# Patient Record
Sex: Male | Born: 1985 | Race: Black or African American | Hispanic: No | Marital: Single | Smoking: Current every day smoker
Health system: Southern US, Community
[De-identification: ages and names within clinical notes are randomized; demographics above are authoritative.]

---

## 2019-10-31 ENCOUNTER — Encounter: Payer: Self-pay | Admitting: Emergency Medicine

## 2019-10-31 ENCOUNTER — Other Ambulatory Visit: Payer: Self-pay

## 2019-10-31 ENCOUNTER — Ambulatory Visit
Admission: EM | Admit: 2019-10-31 | Discharge: 2019-10-31 | Disposition: A | Discharge status: Home / Self Care | Payer: Self-pay | Attending: Emergency Medicine | Admitting: Emergency Medicine

## 2019-10-31 DIAGNOSIS — K05219 Aggressive periodontitis, localized, unspecified severity: Secondary | ICD-10-CM

## 2019-10-31 MED ORDER — AMOXICILLIN-POT CLAVULANATE 875-125 MG PO TABS: 1.0000 | ORAL_TABLET | Freq: Two times a day (BID) | ORAL | 0 refills | Status: AC

## 2019-10-31 NOTE — Discharge Instructions (Signed)
Finish the Augmentin, even if you feel better.  Continue Listerine and salt water rinses.  Follow up With a primary care physician and dentist as soon as you can.  See list below Here is a list of primary care providers who are taking new patients:  Dr. Otilio Miu, Dr. Adline Potter [address] [place] [address] [phone]  [facility] [place] [address]  [phone]  [facility] [address] [place], [place] [address] [phone]  [facility]  [phone] [place], [place] [address]  Here are clinics/ other resources who will see you if you do not have insurance. Some have certain criteria that you must meet. Call them and find out what they are:  [facility]: [address], [place], [place] [address] Phone: [phone] Hours: First and Third Saturdays of each Month, 9 a.m. - 1 p.m.  [facility]: [address], [place], [place], [place] [address] Phone: [phone] Hours: Tuesday, 4 p.m. - 8 p.m. Thursday, 1 p.m. - 8 p.m. Wednesday, 9 a.m. - [facility] [address], [place], [place] [address] Phone: [phone] Pharmacy Phone Number: [phone] Dental Phone Number: [phone] [facility]: [phone]  Dental Hours: Monday - Thursday, 8 a.m. - 6 p.m.  [facility] [address], [place], [place] [address] Phone: [phone] Pharmacy Phone Number: [phone] [facility] Insurance Help: [phone]  [facility] [address] [place], [place] [address] Phone: [phone] Pharmacy Phone Number: [phone] [facility] Insurance Help: [phone]  [facility] [address] [place], [place] [address] Phone: [phone] [facility] Insurance Help: [phone]   [place] [place],  [address] Phone: [phone]  Go to  www.goodrx.com to look up your medications. This will give you a list of where you can find your prescriptions at the most affordable prices. Or ask the pharmacist what the cash price is, or if they have any other discount programs available to help make your medication more affordable. This can be less expensive than what you would pay with insurance.

## 2019-10-31 NOTE — ED Triage Notes (Signed)
Patient c/o mouth pain and abseces that started a week ago.  Patient denies fevers.

## 2019-10-31 NOTE — ED Provider Notes (Signed)
HPI  SUBJECTIVE:  Ethan Riley is a 33 y.o. male who presents with 2 weeks of painful gingival swelling, abscess.  States that he has been using Listerine mouthwash and ice.  States that the abscess ruptured last night and the pain has gotten much better.  No aggravating factors.  No fevers, facial swelling, trismus, sensation of throat swelling shut, neck stiffness, dental pain.  No antipyretic in the past 4 to 6 hours.  He is a smoker.  No history of diabetes, chronic kidney disease.  Tetanus up-to-date.  PMD: None.  Dentistry: None.    History reviewed. No pertinent past medical history.  History reviewed. No pertinent surgical history.  Family History  Problem Relation Age of Onset  . Diabetes Father     Social History   Tobacco Use  . Smoking status: Current Every Day Smoker    Types: Cigarettes  . Smokeless tobacco: Never Used  Substance Use Topics  . Alcohol use: Yes  . Drug use: Never    No current facility-administered medications for this encounter.   Current Outpatient Medications:  .  amoxicillin-clavulanate (AUGMENTIN) 875-125 MG tablet, Take 1 tablet by mouth 2 (two) times daily for 7 days., Disp: 14 tablet, Rfl: 0  No Known Allergies   ROS  As noted in HPI.   Physical Exam  BP 106/64 (BP Location: Left Arm)   Pulse 67   Temp 98.6 F (37 C) (Oral)   Resp 16   Ht 5\' 10"  (1.778 m)   Wt 61.2 kg   SpO2 100%   BMI 19.37 kg/m   Constitutional: Well developed, well nourished, no acute distress Eyes:  EOMI, conjunctiva normal bilaterally HENT: Normocephalic, atraumatic,mucus membranes moist.  Dentition intact.  No tenderness over tooth #23, lateral incisor.  Drained gingival abscess inferior to this.  No expressible purulent drainage.  Positive gingival swelling.  No other obvious gingival abscesses.  No facial swelling.  No trismus. Neck: No cervical lymphadenopathy. Respiratory: Normal inspiratory effort Cardiovascular: Normal rate GI:  nondistended skin: No rash, skin intact Musculoskeletal: no deformities Neurologic: Alert & oriented x 3, no focal neuro deficits Psychiatric: Speech and behavior appropriate   ED Course   Medications - No data to display  No orders of the defined types were placed in this encounter.   No results found for this or any previous visit (from the past 24 hour(s)). No results found.  ED Clinical Impression  1. Gingival abscess      ED Assessment/Plan  Patient with gingival abscess.  Does not appear to be from dental decay.  However will send home with Augmentin for 7 to 10 days, continue Listerine and salt water irrigation.  Will provide primary care list for routine care.  Also has dental information on it..  Discussed MDM, treatment plan, and plan for follow-up with patient. Discussed sn/sx that should prompt return to the ED. patient agrees with plan.   Meds ordered this encounter  Medications  . amoxicillin-clavulanate (AUGMENTIN) 875-125 MG tablet    Sig: Take 1 tablet by mouth 2 (two) times daily for 7 days.    Dispense:  14 tablet    Refill:  0    *This clinic note was created using [employer]. Therefore, there may be occasional mistakes despite careful proofreading.   ?    Melynda Ripple, MD 11/01/19 1514

## 2020-05-26 ENCOUNTER — Emergency Department: Payer: No Typology Code available for payment source

## 2020-05-26 ENCOUNTER — Emergency Department
Admission: EM | Admit: 2020-05-26 | Discharge: 2020-05-26 | Disposition: A | Discharge status: Other Acute Inpatient Hospital | Payer: No Typology Code available for payment source | Attending: Emergency Medicine | Admitting: Emergency Medicine

## 2020-05-26 ENCOUNTER — Encounter: Payer: Self-pay | Admitting: Radiology

## 2020-05-26 DIAGNOSIS — M542 Cervicalgia: Secondary | ICD-10-CM

## 2020-05-26 DIAGNOSIS — S36116A Major laceration of liver, initial encounter: Secondary | ICD-10-CM | POA: Diagnosis not present

## 2020-05-26 DIAGNOSIS — F17210 Nicotine dependence, cigarettes, uncomplicated: Secondary | ICD-10-CM | POA: Diagnosis not present

## 2020-05-26 DIAGNOSIS — Z20822 Contact with and (suspected) exposure to covid-19: Secondary | ICD-10-CM | POA: Insufficient documentation

## 2020-05-26 DIAGNOSIS — Z041 Encounter for examination and observation following transport accident: Secondary | ICD-10-CM | POA: Diagnosis present

## 2020-05-26 DIAGNOSIS — G8911 Acute pain due to trauma: Secondary | ICD-10-CM | POA: Diagnosis not present

## 2020-05-26 DIAGNOSIS — Y999 Unspecified external cause status: Secondary | ICD-10-CM | POA: Insufficient documentation

## 2020-05-26 DIAGNOSIS — M5020 Other cervical disc displacement, unspecified cervical region: Secondary | ICD-10-CM

## 2020-05-26 DIAGNOSIS — Y9389 Activity, other specified: Secondary | ICD-10-CM | POA: Insufficient documentation

## 2020-05-26 DIAGNOSIS — T1490XA Injury, unspecified, initial encounter: Secondary | ICD-10-CM

## 2020-05-26 DIAGNOSIS — Y929 Unspecified place or not applicable: Secondary | ICD-10-CM | POA: Insufficient documentation

## 2020-05-26 DIAGNOSIS — S199XXA Unspecified injury of neck, initial encounter: Secondary | ICD-10-CM | POA: Diagnosis not present

## 2020-05-26 LAB — CBC WITH DIFFERENTIAL/PLATELET
Abs Immature Granulocytes: 0.14 10*3/uL — ABNORMAL HIGH (ref 0.00–0.07)
Basophils Absolute: 0 10*3/uL (ref 0.0–0.1)
Basophils Relative: 0 %
Eosinophils Absolute: 0.2 10*3/uL (ref 0.0–0.5)
Eosinophils Relative: 2 %
HCT: 43.5 % (ref 39.0–52.0)
Hemoglobin: 15.1 g/dL (ref 13.0–17.0)
Immature Granulocytes: 2 %
Lymphocytes Relative: 22 %
Lymphs Abs: 2.2 10*3/uL (ref 0.7–4.0)
MCH: 26.2 pg (ref 26.0–34.0)
MCHC: 34.7 g/dL (ref 30.0–36.0)
MCV: 75.4 fL — ABNORMAL LOW (ref 80.0–100.0)
Monocytes Absolute: 0.5 10*3/uL (ref 0.1–1.0)
Monocytes Relative: 6 %
Neutro Abs: 6.6 10*3/uL (ref 1.7–7.7)
Neutrophils Relative %: 68 %
Platelets: 155 10*3/uL (ref 150–400)
RBC: 5.77 MIL/uL (ref 4.22–5.81)
RDW: 15 % (ref 11.5–15.5)
WBC: 9.6 10*3/uL (ref 4.0–10.5)
nRBC: 0 % (ref 0.0–0.2)

## 2020-05-26 LAB — URINE DRUG SCREEN, QUALITATIVE (ARMC ONLY)
Amphetamines, Ur Screen: NOT DETECTED
Barbiturates, Ur Screen: NOT DETECTED
Benzodiazepine, Ur Scrn: NOT DETECTED
Cannabinoid 50 Ng, Ur ~~LOC~~: POSITIVE — AB
Cocaine Metabolite,Ur ~~LOC~~: NOT DETECTED
MDMA (Ecstasy)Ur Screen: NOT DETECTED
Methadone Scn, Ur: NOT DETECTED
Opiate, Ur Screen: NOT DETECTED
Phencyclidine (PCP) Ur S: NOT DETECTED
Tricyclic, Ur Screen: NOT DETECTED

## 2020-05-26 LAB — COMPREHENSIVE METABOLIC PANEL
ALT: 23 U/L (ref 0–44)
AST: 28 U/L (ref 15–41)
Albumin: 4.1 g/dL (ref 3.5–5.0)
Alkaline Phosphatase: 44 U/L (ref 38–126)
Anion gap: 8 (ref 5–15)
BUN: 12 mg/dL (ref 6–20)
CO2: 23 mmol/L (ref 22–32)
Calcium: 8.4 mg/dL — ABNORMAL LOW (ref 8.9–10.3)
Chloride: 106 mmol/L (ref 98–111)
Creatinine, Ser: 1.07 mg/dL (ref 0.61–1.24)
GFR calc Af Amer: >60 mL/min (ref >60)
GFR calc non Af Amer: >60 mL/min (ref >60)
Glucose, Bld: 87 mg/dL (ref 70–99)
Potassium: 3.5 mmol/L (ref 3.5–5.1)
Sodium: 137 mmol/L (ref 135–145)
Total Bilirubin: 0.7 mg/dL (ref 0.3–1.2)
Total Protein: 6.8 g/dL (ref 6.5–8.1)

## 2020-05-26 LAB — TYPE AND SCREEN
ABO/RH(D): A NEG
Antibody Screen: NEGATIVE

## 2020-05-26 LAB — SARS CORONAVIRUS 2 BY RT PCR (HOSPITAL ORDER, PERFORMED IN ~~LOC~~ HOSPITAL LAB): SARS Coronavirus 2: NEGATIVE

## 2020-05-26 LAB — LACTIC ACID, PLASMA: Lactic Acid, Venous: 1.5 mmol/L (ref 0.5–1.9)

## 2020-05-26 LAB — PROTIME-INR
INR: 1 (ref 0.8–1.2)
Prothrombin Time: 13.1 seconds (ref 11.4–15.2)

## 2020-05-26 LAB — ETHANOL: Alcohol, Ethyl (B): <10 mg/dL (ref <10)

## 2020-05-26 MED ORDER — IOHEXOL 300 MG/ML  SOLN
100.0000 mL | Freq: Once | INTRAMUSCULAR | Status: AC | PRN
  Administered 2020-05-26: 100 mL via INTRAVENOUS

## 2020-05-26 MED ORDER — ONDANSETRON HCL 4 MG/2ML IJ SOLN
4.0000 mg | INTRAMUSCULAR | Status: AC
  Administered 2020-05-26: 4 mg via INTRAVENOUS
  Filled 2020-05-26: qty 2

## 2020-05-26 MED ORDER — SODIUM CHLORIDE 0.9 % IV BOLUS
500.0000 mL | Freq: Once | INTRAVENOUS | Status: AC
  Administered 2020-05-26: 500 mL via INTRAVENOUS

## 2020-05-26 MED ORDER — IOHEXOL 350 MG/ML SOLN
100.0000 mL | Freq: Once | INTRAVENOUS | Status: AC | PRN
  Administered 2020-05-26: 100 mL via INTRAVENOUS

## 2020-05-26 MED ORDER — MORPHINE SULFATE (PF) 4 MG/ML IV SOLN
4.0000 mg | Freq: Once | INTRAVENOUS | Status: AC
  Administered 2020-05-26: 4 mg via INTRAVENOUS
  Filled 2020-05-26: qty 1

## 2020-05-26 MED ORDER — SODIUM CHLORIDE 0.9 % IV BOLUS
1000.0000 mL | Freq: Once | INTRAVENOUS | Status: AC
  Administered 2020-05-26: 1000 mL via INTRAVENOUS
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
## 2020-05-26 NOTE — ED Notes (Signed)
Pt transported to MRI and then back to CT.

## 2021-04-20 ENCOUNTER — Other Ambulatory Visit: Payer: Self-pay

## 2021-04-20 DIAGNOSIS — T1490XA Injury, unspecified, initial encounter: Secondary | ICD-10-CM

## 2021-12-05 DIAGNOSIS — R52 Pain, unspecified: Secondary | ICD-10-CM | POA: Diagnosis not present

## 2021-12-05 DIAGNOSIS — M545 Low back pain, unspecified: Secondary | ICD-10-CM | POA: Diagnosis not present

## 2021-12-05 DIAGNOSIS — M549 Dorsalgia, unspecified: Secondary | ICD-10-CM | POA: Diagnosis not present
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
## 2022-08-23 ENCOUNTER — Telehealth: Payer: Self-pay

## 2022-08-23 NOTE — Telephone Encounter (Signed)
Patient called to connect with a Primary Care Provider. Unable to leave VM, VM not set up/VM full. Patient has Managed Medicaid. If patient returns call, please reach out to Layna Roeper or Leslie, RN.   

## 2022-08-30 ENCOUNTER — Telehealth: Payer: Self-pay

## 2022-08-30 NOTE — Telephone Encounter (Signed)
Patient called to connect with a Primary Care Provider. Unable to leave VM, VM not set up/VM full. Patient has Managed Medicaid. If patient returns call, please reach out to Dimitra Woodstock or Leslie, RN.   

## 2022-09-07 ENCOUNTER — Telehealth: Payer: Self-pay

## 2022-09-07 NOTE — Telephone Encounter (Signed)
Patient called to connect with a Primary Care Provider. Unable to leave VM, VM not set up/VM full. Patient has Managed Medicaid. If patient returns call, please reach out to Analiza Cowger or Leslie, RN.
